# Patient Record
Sex: Female | Born: 1945 | Race: White | Hispanic: No
Health system: Southern US, Community
[De-identification: ages and names within clinical notes are randomized; demographics above are authoritative.]

---

## 2005-07-17 ENCOUNTER — Emergency Department (HOSPITAL_COMMUNITY): Admission: EM | Admit: 2005-07-17 | Discharge: 2005-07-17 | Payer: Self-pay | Admitting: Emergency Medicine

## 2006-11-07 IMAGING — CR DG WRIST COMPLETE 3+V*L*
4 series · 4 of 4 positions shown · non-contrast
Comparison: None.

CLINICAL DATA: Fell ? pain and swelling. 
 LEFT WRIST ? 4 VIEW:

[view not recorded (1 of 4)]
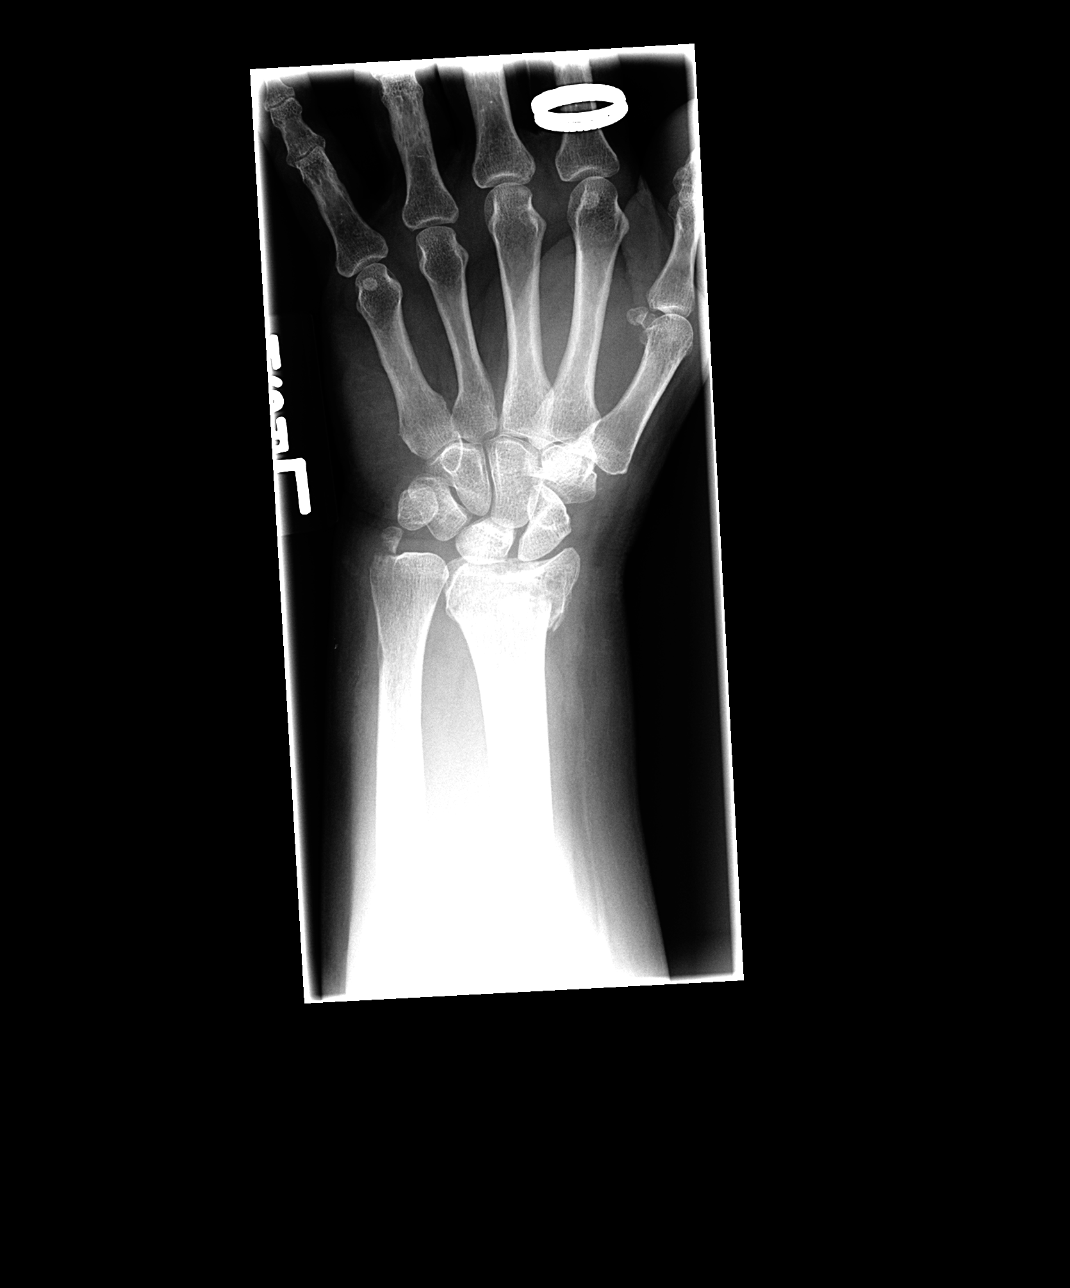

[view not recorded (2 of 4)]
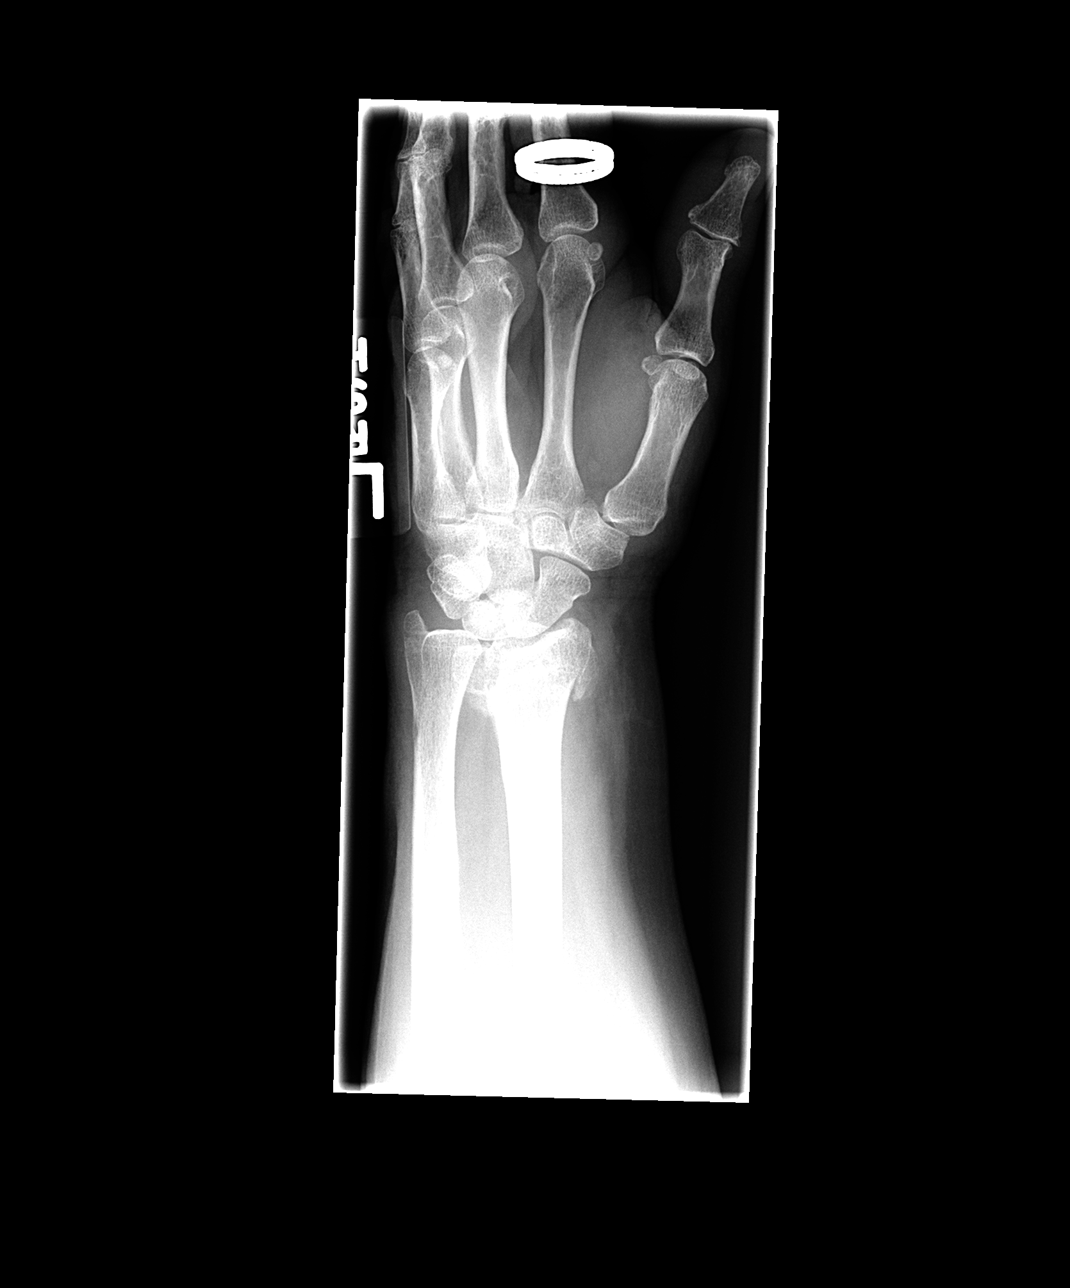

[view not recorded (3 of 4)]
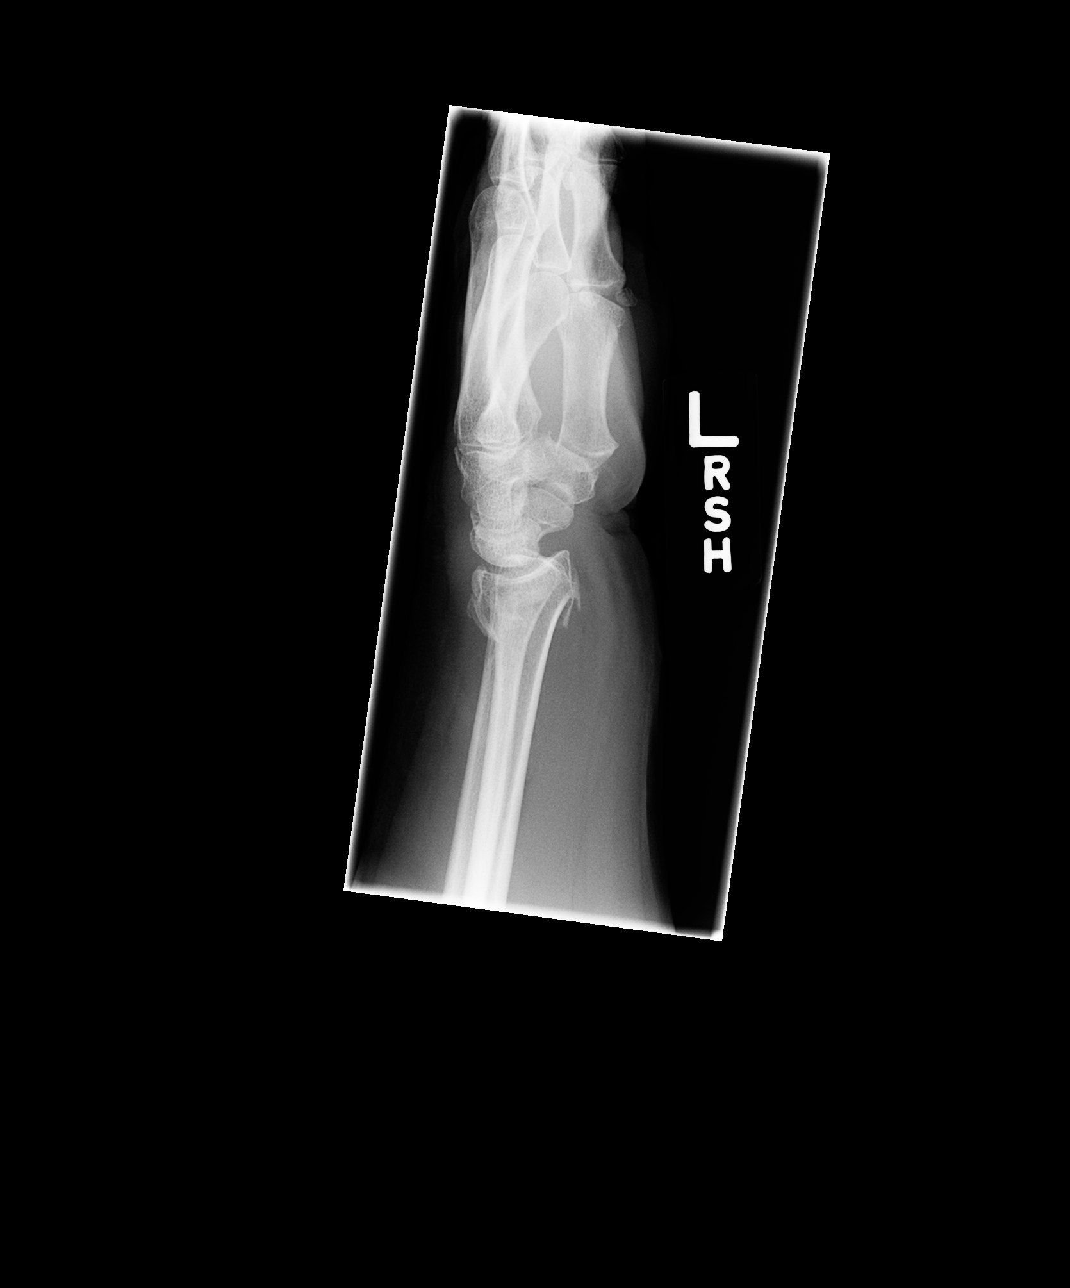

[view not recorded (4 of 4)]
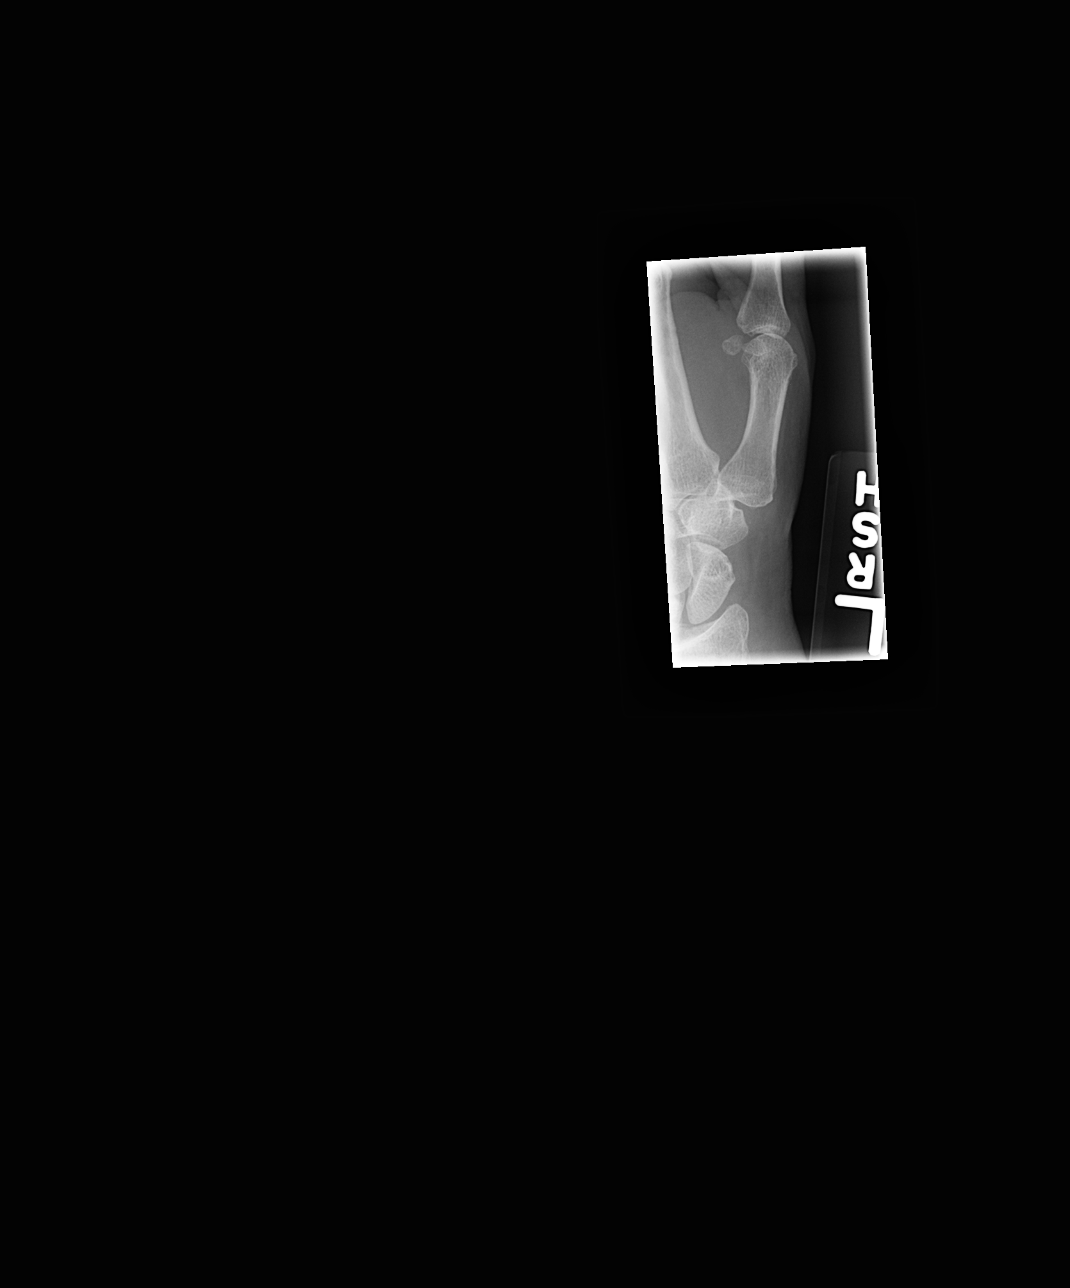

[4 of 4 positions shown; findings below may reference images not displayed]

FINDINGS: There is a comminuted fracture of the distal radius with some dorsal tilt and impaction.  There is also a fracture of the ulnar styloid process.
IMPRESSION: Fractures of the distal radius and ulnar styloid process.

## 2006-11-07 IMAGING — CR DG WRIST COMPLETE 3+V*L*
3 series · 3 of 3 positions shown · non-contrast
Comparison: Earlier films today.

CLINICAL DATA: Post-reduction.
LEFT WRIST COMPLETE ? 3 VIEW:

[x wrist pa left]
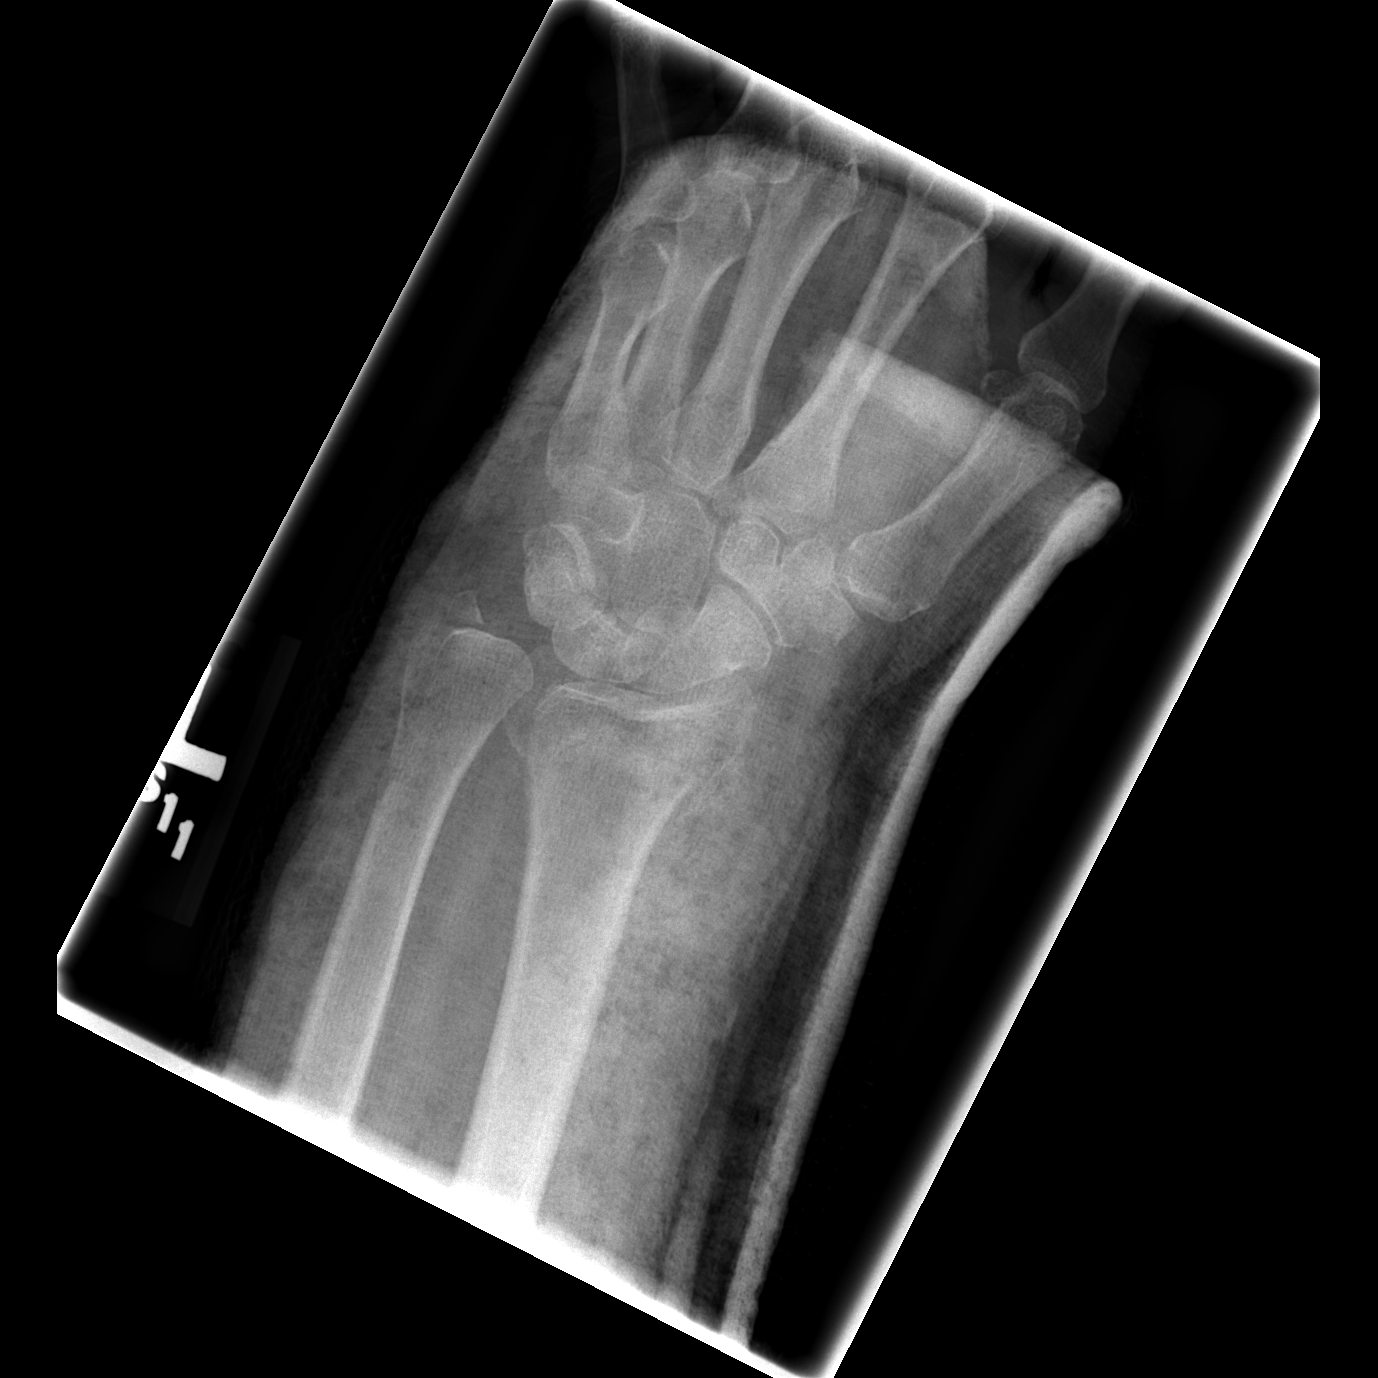

[x wrist obl left]
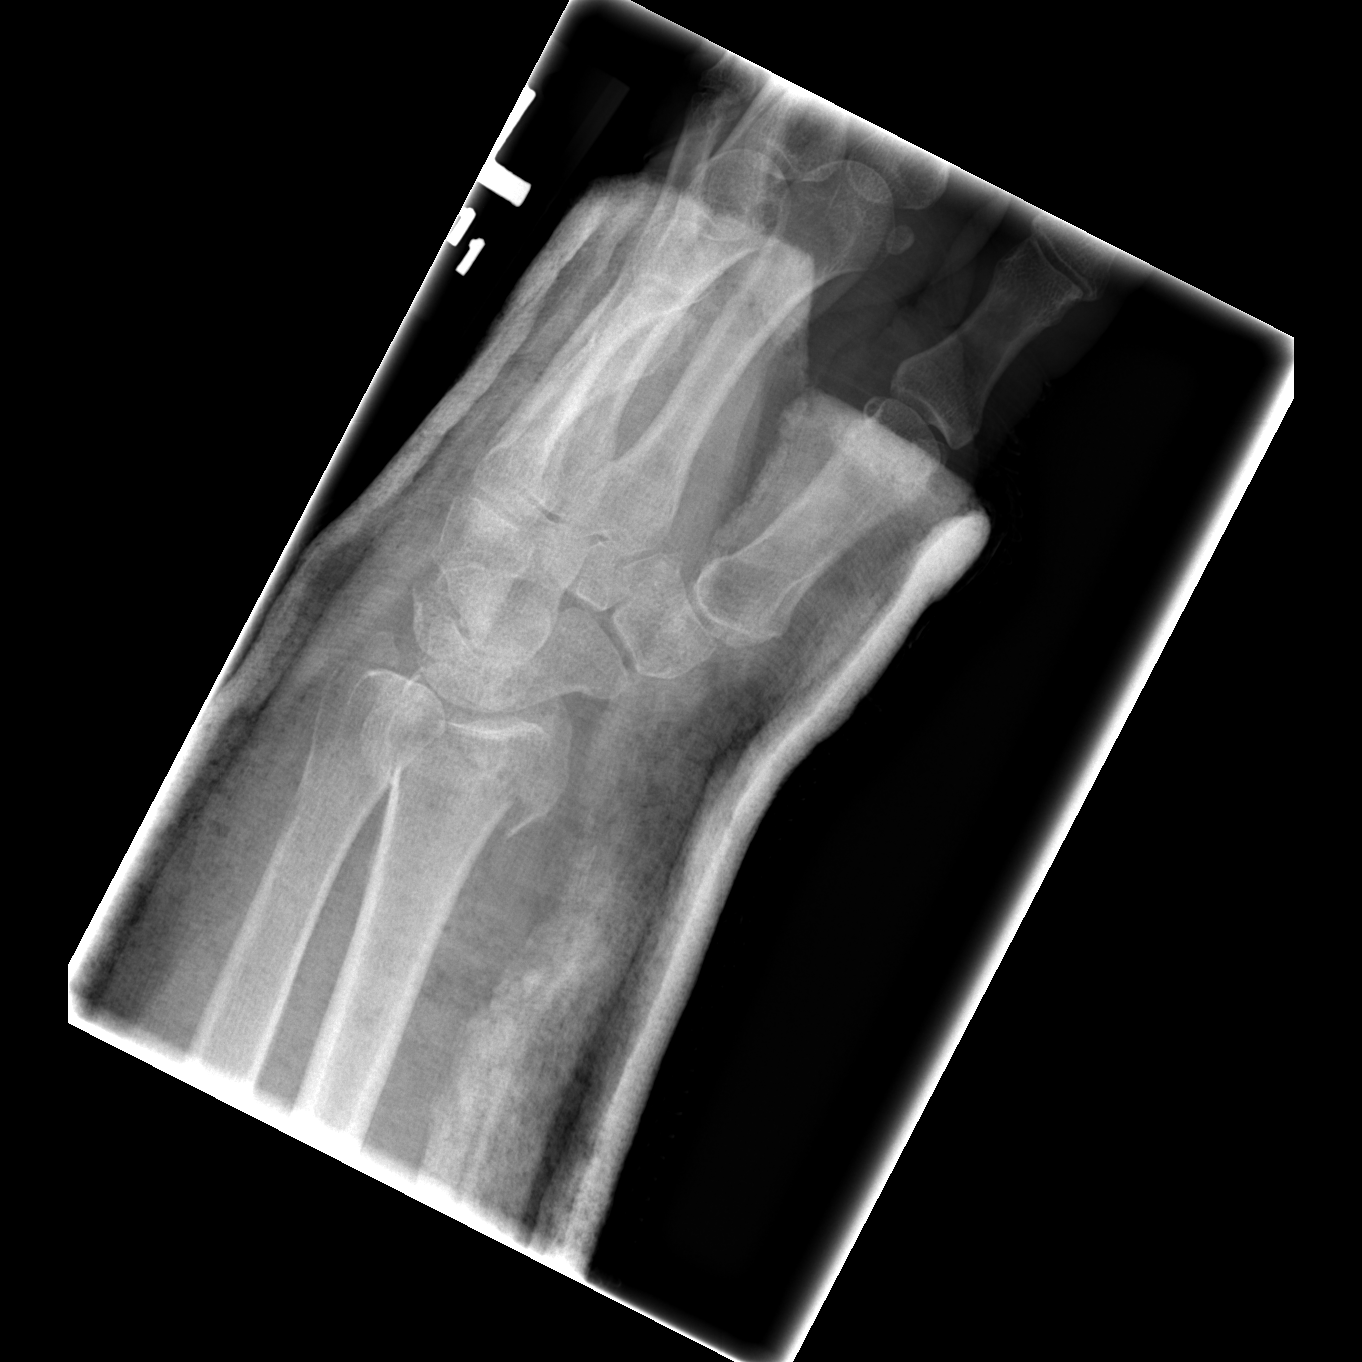

[x wrist lat left]
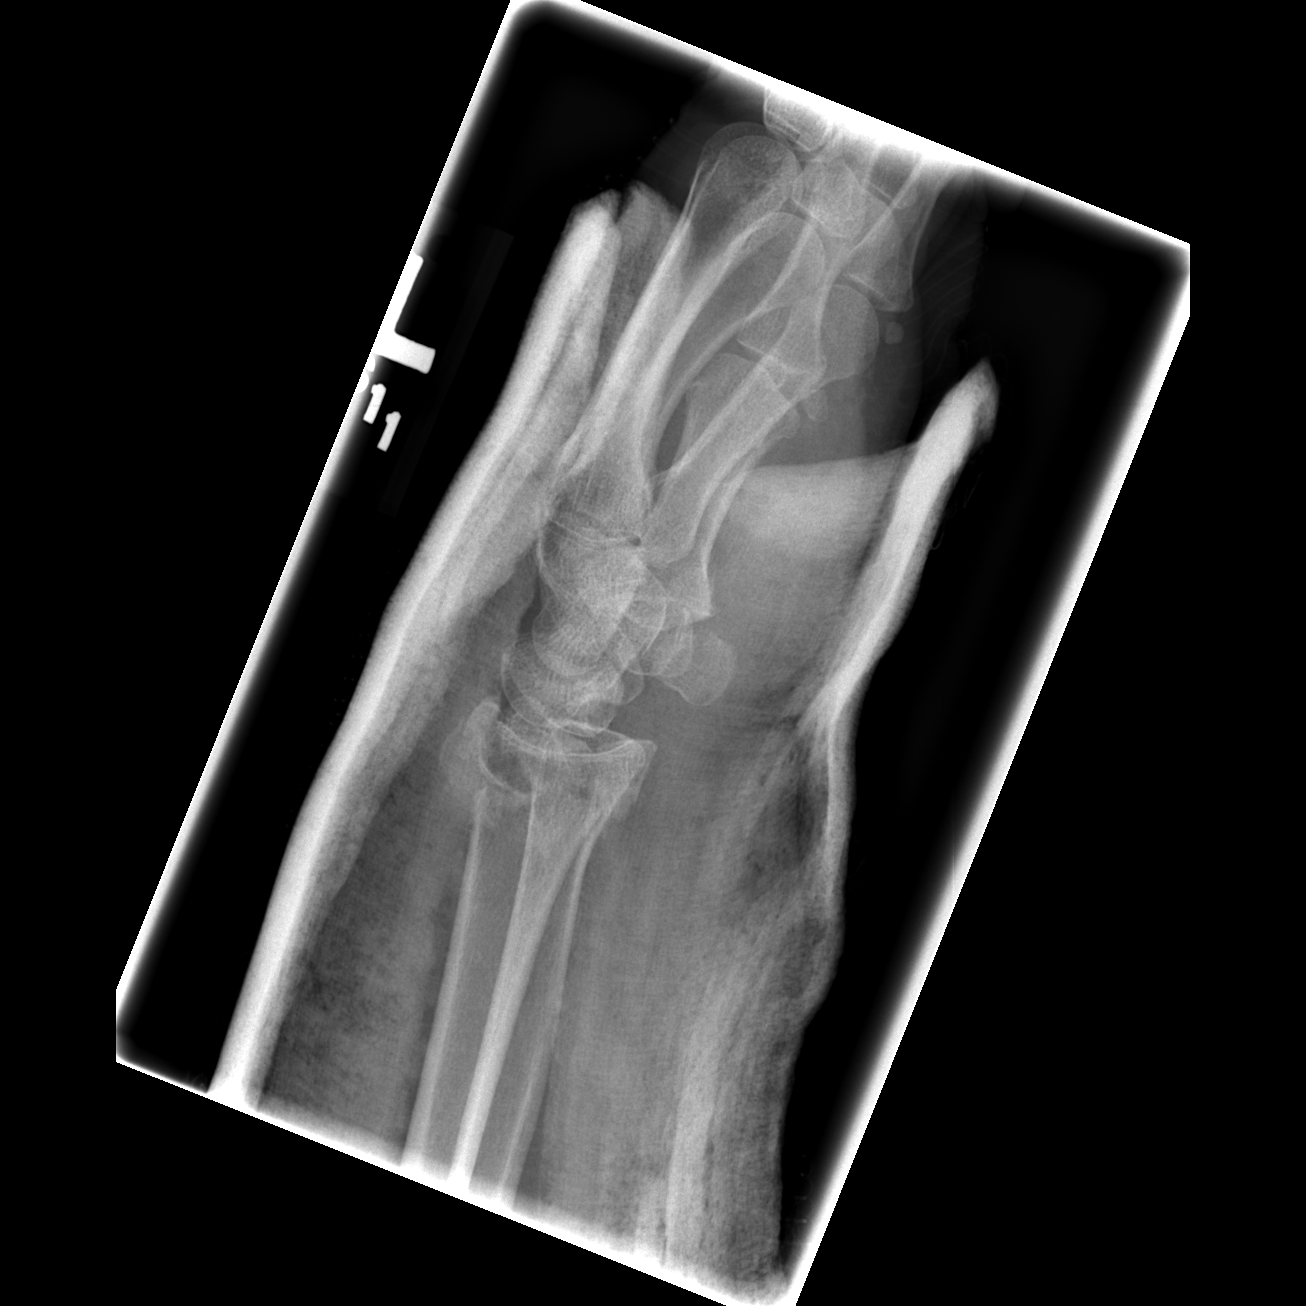

[3 of 3 positions shown; findings below may reference images not displayed]

FINDINGS: The extremity is now in plaster.  There is less dorsal tilt.  There is overall good position and alignment.
IMPRESSION: Less dorsal tilt, post-reduction.

## 2021-02-03 DIAGNOSIS — I10 Essential (primary) hypertension: Secondary | ICD-10-CM | POA: Diagnosis not present

## 2021-02-03 DIAGNOSIS — R29810 Facial weakness: Secondary | ICD-10-CM | POA: Diagnosis not present

## 2021-02-03 DIAGNOSIS — R42 Dizziness and giddiness: Secondary | ICD-10-CM | POA: Diagnosis not present

## 2023-04-10 ENCOUNTER — Telehealth (HOSPITAL_COMMUNITY): Payer: Self-pay

## 2023-04-10 NOTE — Telephone Encounter (Signed)
Received an outside referral by Clovis Cao from [facility] for cardiac rehab. Called pt to see if she was interested in the cardiac rehab program with no response. LMTCB. Will close after 2 weeks if no response.

## 2023-04-27 ENCOUNTER — Telehealth (HOSPITAL_COMMUNITY): Payer: Self-pay

## 2023-04-27 NOTE — Telephone Encounter (Signed)
No response from pt in regards to cardiac rehab. Closed referral
